# Patient Record
Sex: Female | Born: 1955 | Race: White | Hispanic: No | Marital: Married
Health system: Southern US, Community
[De-identification: ages and names within clinical notes are randomized; demographics above are authoritative.]

---

## 2012-08-20 ENCOUNTER — Ambulatory Visit: Payer: Self-pay | Admitting: Obstetrics and Gynecology

## 2013-08-24 ENCOUNTER — Ambulatory Visit: Payer: Self-pay | Admitting: Obstetrics and Gynecology

## 2013-11-03 ENCOUNTER — Ambulatory Visit: Payer: Self-pay | Admitting: Unknown Physician Specialty

## 2013-11-09 ENCOUNTER — Ambulatory Visit: Payer: Self-pay | Admitting: Unknown Physician Specialty

## 2013-12-08 ENCOUNTER — Ambulatory Visit: Payer: Self-pay | Admitting: Unknown Physician Specialty

## 2013-12-09 LAB — PATHOLOGY REPORT

## 2014-09-21 ENCOUNTER — Ambulatory Visit: Payer: Self-pay | Admitting: Obstetrics and Gynecology

## 2014-11-13 NOTE — Op Note (Signed)
PATIENT NAME:  Tara Barr, Tara Barr MR#:  [id number] DATE OF BIRTH:  02-01-1956  DATE OF PROCEDURE:  12/08/2013  PREOPERATIVE DIAGNOSIS: Parathyroid adenoma.  POSTOPERATIVE DIAGNOSIS: Parathyroid adenoma.  PROCEDURES PERFORMED: Excision of right upper posterior parathyroid adenoma, laryngeal monitoring for 1 hour.   SURGEON: Linus Salmonshapman Rushie Brazel, MD  ASSISTANT: Dr. Andee PolesVaught  OPERATIVE FINDINGS: Approximately 1.5 x 2 cm soft adenoma in the posterior superior quadrant just over the anterior spine.   DESCRIPTION OF PROCEDURE: Tara Barr was identified in the holding area, taken to the operating room, and placed in the supine position. After being intubated with laryngeal safe endotracheal tube, the neck was gently extended and prepped and draped in standard fashion. A local anesthetic of 1% lidocaine with 1:100,000 epinephrine was used to inject along the natural skin crease overlying the cricoid cartilage. With the neck prepped and draped sterilely, a 15 blade was used to incise down to and through the platysma muscle. Hemostasis was achieved using the Bovie cautery. The strap muscles were identified in the midline and retracted laterally. There were multiple feeding vessels into the right lobe of the thyroid which were divided using the Harmonic scalpel. The gland was then medialized. The recurrent laryngeal nerve was identified in the tracheoesophageal groove and stimulated and remained intact throughout the case. Based on the sestamibi scan and the CT scan in the room, as the gland was retracted medially, it appeared that the adenoma was overlying the anterior cervical spine in the posterior superior and aspect behind the thyroid gland. Careful dissection in this area identified what appeared to be a parathyroid adenoma measuring approximately 1.5 x 2 cm. This was gently dissected free from the surrounding tissue and was clearly separate from the thyroid gland. The microbipolar was used to dissect this free from  the surrounding tissue. This was sent down for frozen section. Dr. Oneita Krasubinas, in pathology, called back to say that this did appear to be a parathyroid adenoma. With this identified, the area was then copiously irrigated with saline. There were several small bleeding areas which were cauterized using the microbipolar. Once this was performed, there was no active bleeding. Surgicel was then placed into the surgical bed. With no active bleeding, the strap muscles were reapproximated in the midline using 4-0 Vicryl, the platysmal layer was closed using 4-0 Vicryl, subcutaneous tissues were closed using 4-0 Vicryl, and the skin was closed using Dermabond. The patient was then returned to anesthesia where she was awakened in the operating room and taken to the recovery room in stable condition.   CULTURES: None.   SPECIMENS: Parathyroid adenoma.   ESTIMATED BLOOD LOSS: Less than 20 mL.  ____________________________ Davina Pokehapman T. Caffie Sotto, MD ctm:sb D: 12/08/2013 08:53:37 ET T: 12/08/2013 09:14:51 ET JOB#: [id number]  cc: Davina Pokehapman T. Marylon Verno, MD, <Dictator> Davina PokeHAPMAN T Aayan Haskew MD ELECTRONICALLY SIGNED 12/11/2013 10:09

## 2015-06-17 IMAGING — CT NM PARTHYROID
1 series · 12 of 14 positions shown, 15 images · non-contrast
Comparison: Fused CT scan.

ADDENDUM:
I reviewed this study with Dr. Beuk an with Dr. Kazuhide. On the
axial images from the parathyroid scan There is a small focus of
increased activity noted posteriorly on images 23 and 24. This is at
the level of the thyroid gland. It is more difficult to see on the
fused images but is still suspicious for a parathyroid adenoma. Dr.
Kazuhide saved some screen shots to the time line demonstrating the
finding.
CLINICAL DATA: Right carotid bifurcation mass. Possible parathyroid
adenoma.

EXAM:
NM PARATHYROID SCINTIGRAPHY AND SPECT IMAGING
TECHNIQUE: Following intravenous administration of radiopharmaceutical, early
and 2-hour delayed planar images were obtained in the anterior
projection. Delayed triplanar SPECT images were also obtained at 2
hours.
RADIOPHARMACEUTICALS:  [id number] Sestamibi IV

[Series 3: 3d parathroid 1.25 b31s · axial · 0.98mm/px · z∈[+1527,+1706]mm · 12 of 266 slices shown, 15 images]
[im 21/266  soft-tissue]
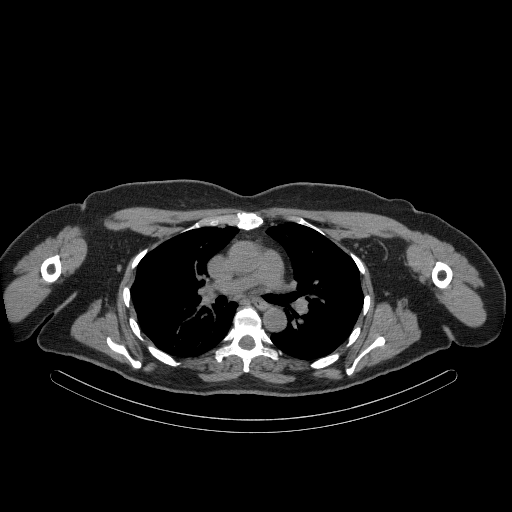
[im 21/266  bone]
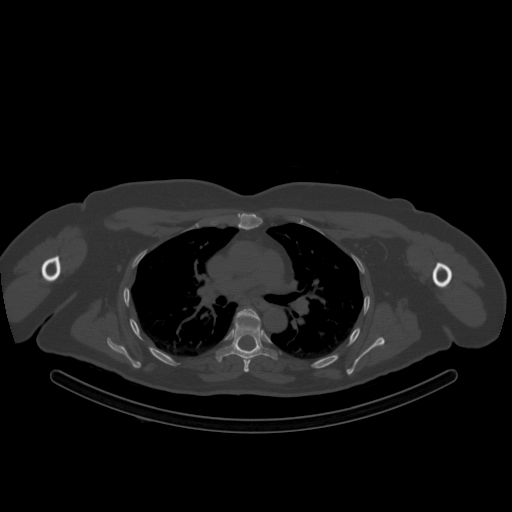
[im 41/266  bone]
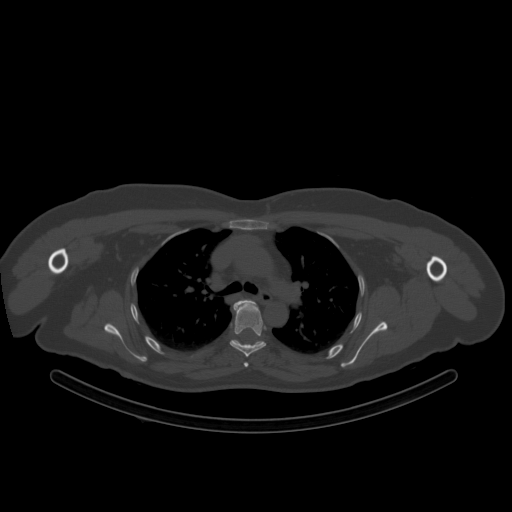
[im 62/266  bone]
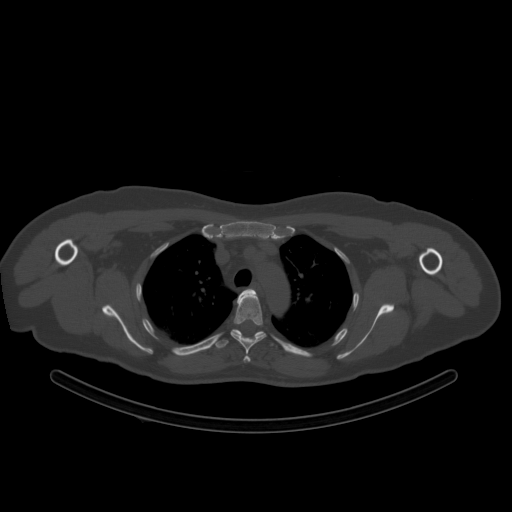
[im 82/266  bone]
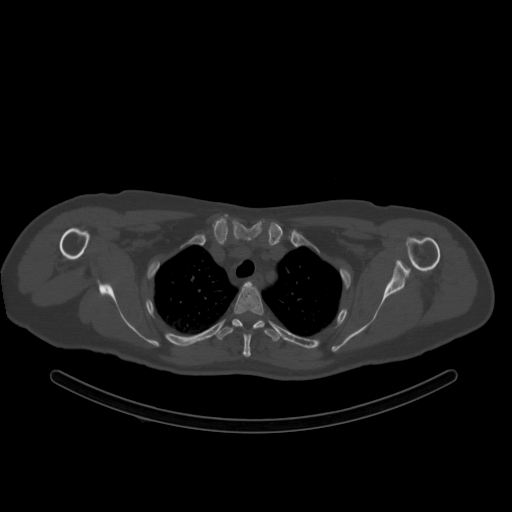
[im 102/266  soft-tissue]
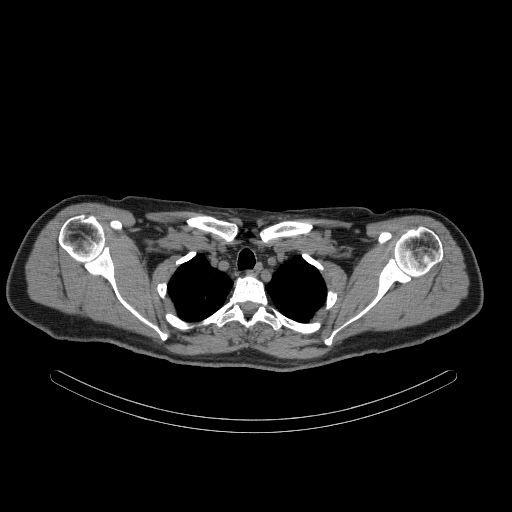
[im 102/266  bone]
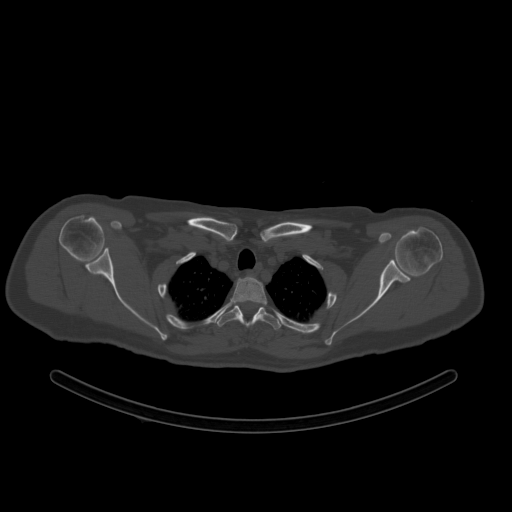
[im 123/266  bone]
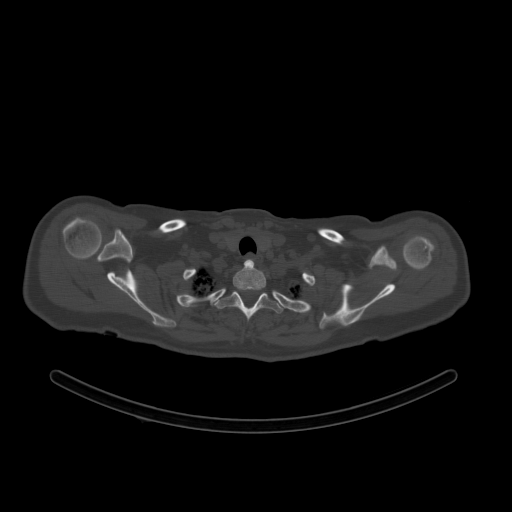
[im 143/266  bone]
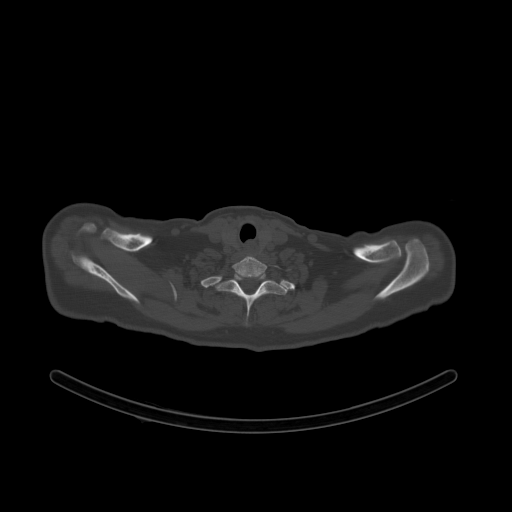
[im 164/266  bone]
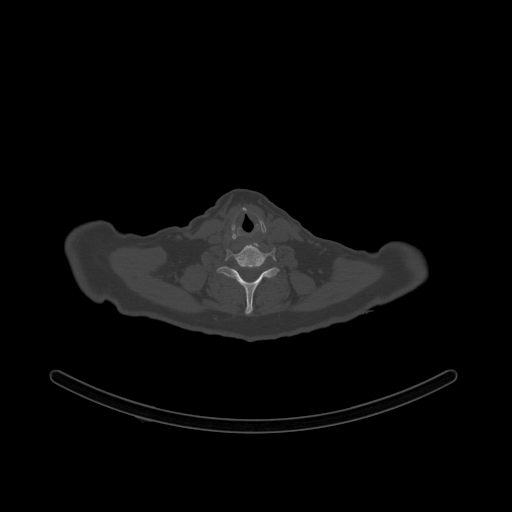
[im 184/266  soft-tissue]
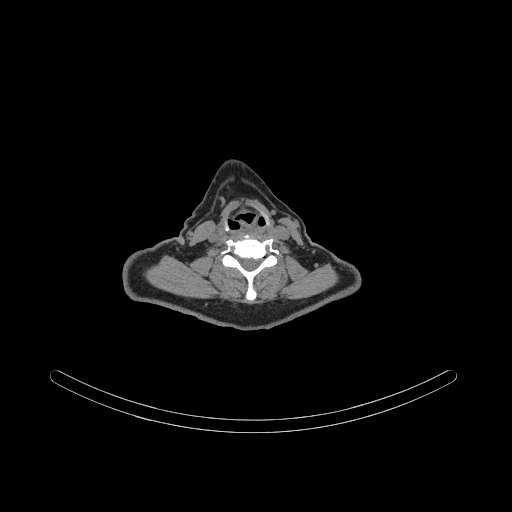
[im 184/266  bone]
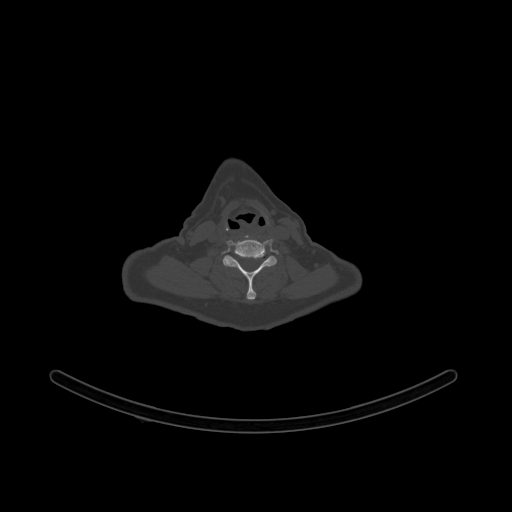
[im 204/266  bone]
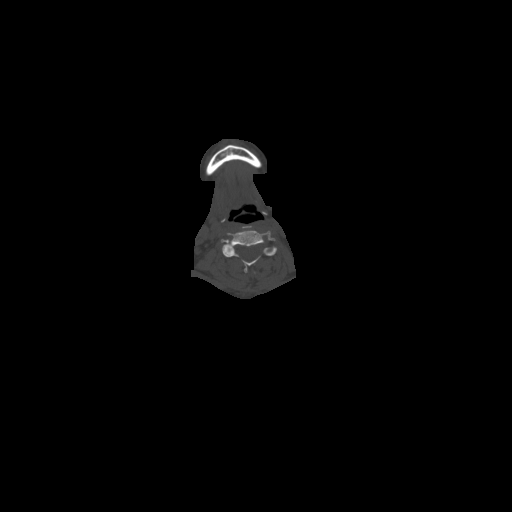
[im 225/266  bone]
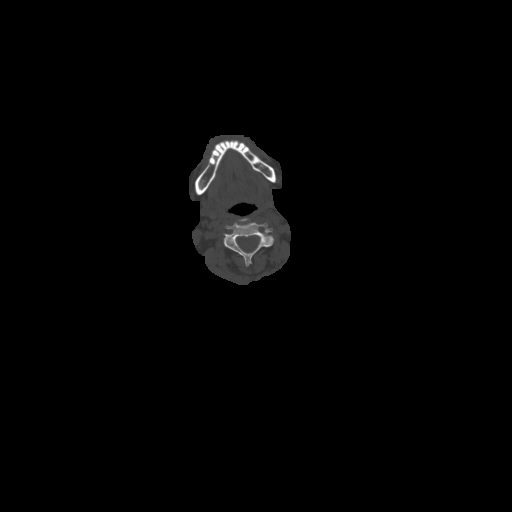
[im 245/266  bone]
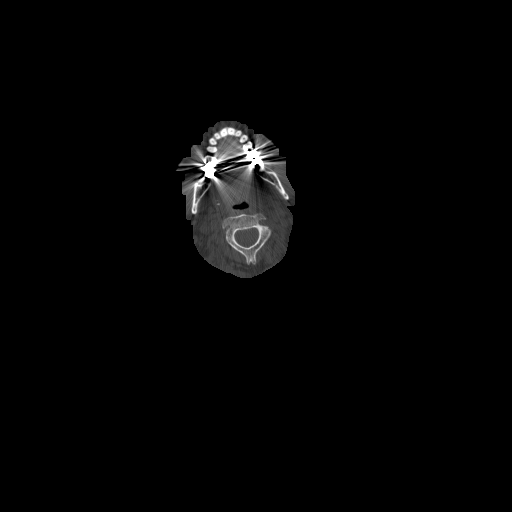

[12 of 14 positions shown; findings below may reference images not displayed]

FINDINGS: There is symmetric uptake in the salivary glands and thyroid gland.
I do not see in the focal area of persistent uptake to suggest a
parathyroid adenoma. I do not see an obvious carotid bifurcation
mass on the right. There is a few small lymph nodes noted.
IMPRESSION: Negative parathyroid scan for parathyroid adenoma.

No obvious carotid bifurcation mass is identified.

## 2015-09-21 ENCOUNTER — Other Ambulatory Visit: Payer: Self-pay | Admitting: Obstetrics and Gynecology

## 2015-09-21 DIAGNOSIS — Z1231 Encounter for screening mammogram for malignant neoplasm of breast: Secondary | ICD-10-CM

## 2015-09-28 ENCOUNTER — Ambulatory Visit
Admission: RE | Admit: 2015-09-28 | Discharge: 2015-09-28 | Disposition: A | Discharge status: Home / Self Care | Payer: BLUE CROSS/BLUE SHIELD | Source: Ambulatory Visit | Attending: Obstetrics and Gynecology | Admitting: Obstetrics and Gynecology

## 2015-09-28 DIAGNOSIS — Z1231 Encounter for screening mammogram for malignant neoplasm of breast: Secondary | ICD-10-CM
